# Patient Record
Sex: Female | Born: 2000 | Race: Black or African American | Hispanic: No | Marital: Single | Smoking: Never smoker
Health system: Southern US, Community
[De-identification: ages and names within clinical notes are randomized; demographics above are authoritative.]

## PROBLEM LIST (undated history)

## (undated) DIAGNOSIS — A549 Gonococcal infection, unspecified: Secondary | ICD-10-CM

## (undated) DIAGNOSIS — A749 Chlamydial infection, unspecified: Secondary | ICD-10-CM

## (undated) DIAGNOSIS — F32A Depression, unspecified: Secondary | ICD-10-CM

## (undated) DIAGNOSIS — F419 Anxiety disorder, unspecified: Secondary | ICD-10-CM

## (undated) HISTORY — PX: NO PAST SURGERIES: SHX2092

## (undated) HISTORY — DX: Gonococcal infection, unspecified: A54.9

## (undated) HISTORY — DX: Depression, unspecified: F32.A

## (undated) HISTORY — DX: Chlamydial infection, unspecified: A74.9

## (undated) HISTORY — DX: Anxiety disorder, unspecified: F41.9

---
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
## 2019-12-21 ENCOUNTER — Encounter (HOSPITAL_COMMUNITY): Payer: Self-pay

## 2019-12-21 ENCOUNTER — Emergency Department (HOSPITAL_COMMUNITY)
Admission: EM | Admit: 2019-12-21 | Discharge: 2019-12-22 | Discharge status: Against Medical Advice | Payer: Managed Care, Other (non HMO) | Attending: Emergency Medicine | Admitting: Emergency Medicine

## 2019-12-21 DIAGNOSIS — Z041 Encounter for examination and observation following transport accident: Secondary | ICD-10-CM | POA: Diagnosis present

## 2019-12-21 DIAGNOSIS — Z5321 Procedure and treatment not carried out due to patient leaving prior to being seen by health care provider: Secondary | ICD-10-CM | POA: Insufficient documentation

## 2019-12-21 NOTE — ED Notes (Signed)
Pt refusing blood work in triage.  

## 2019-12-21 NOTE — ED Notes (Signed)
[phone] Maria Beltran MOTHER VERY WORRIED ABOUT HER DAUGHTER -PLEASE GIVE A CALL BACK
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
## 2019-12-22 NOTE — ED Notes (Signed)
Pt called to see if still in lobby, no response X3

## 2020-05-03 ENCOUNTER — Other Ambulatory Visit: Payer: Self-pay

## 2020-05-03 ENCOUNTER — Other Ambulatory Visit: Payer: Self-pay | Admitting: Obstetrics and Gynecology

## 2020-05-03 ENCOUNTER — Ambulatory Visit: Payer: Medicaid Other | Admitting: *Deleted

## 2020-05-03 ENCOUNTER — Ambulatory Visit: Payer: Medicaid Other | Attending: Obstetrics and Gynecology

## 2020-05-03 VITALS — Ht 62.0 in

## 2020-05-03 DIAGNOSIS — Z363 Encounter for antenatal screening for malformations: Secondary | ICD-10-CM

## 2020-05-03 DIAGNOSIS — O283 Abnormal ultrasonic finding on antenatal screening of mother: Secondary | ICD-10-CM | POA: Insufficient documentation

## 2020-05-03 DIAGNOSIS — Q7959 Other congenital malformations of abdominal wall: Secondary | ICD-10-CM | POA: Diagnosis not present

## 2020-05-03 DIAGNOSIS — Z3A15 15 weeks gestation of pregnancy: Secondary | ICD-10-CM | POA: Diagnosis not present

## 2020-05-03 DIAGNOSIS — O418X10 Other specified disorders of amniotic fluid and membranes, first trimester, not applicable or unspecified: Secondary | ICD-10-CM | POA: Insufficient documentation

## 2020-05-03 IMAGING — US US MFM OB COMP +14 WKS
1 series · 12 of 28 positions shown · non-contrast
Comparison: none

[Series 1: us mfm ob comp +14 wks · 104 acquisitions, 12 frames shown]
[im 4/104]
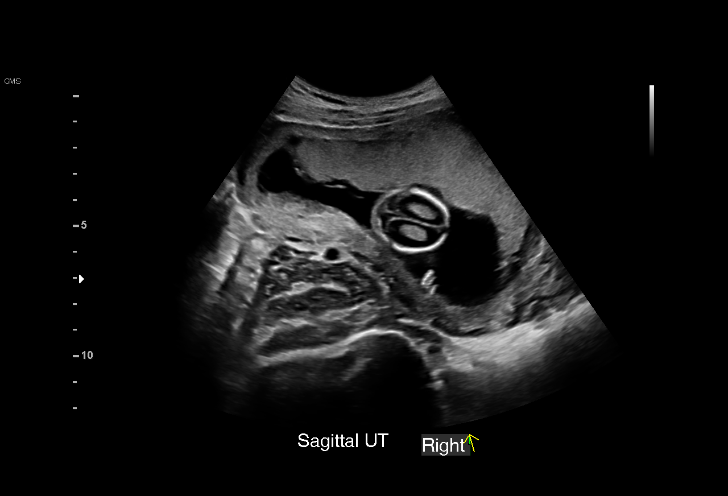
[im 12/104]
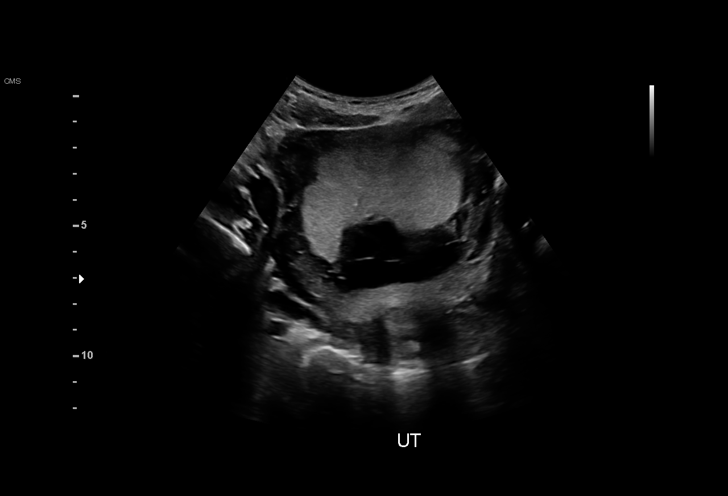
[im 20/104]
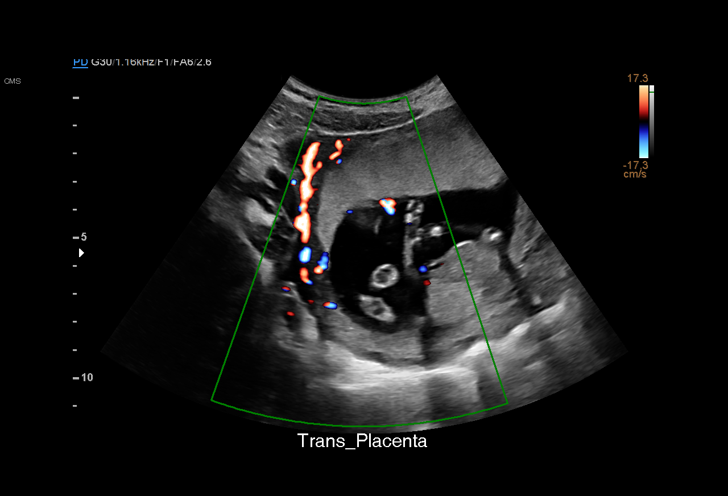
[im 31/104]
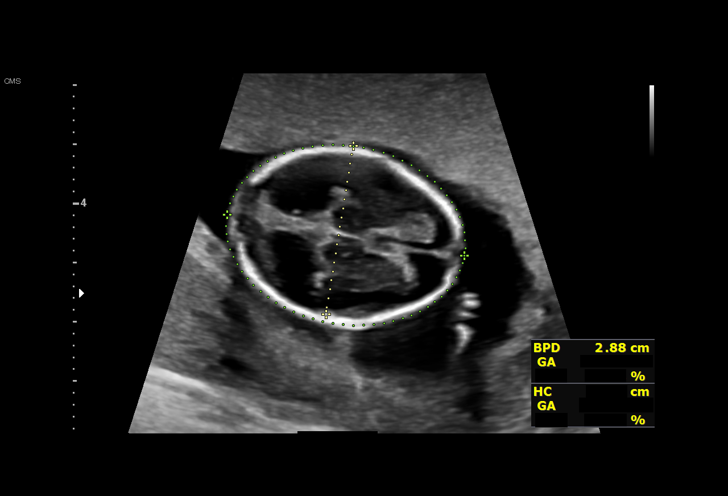
[im 39/104]
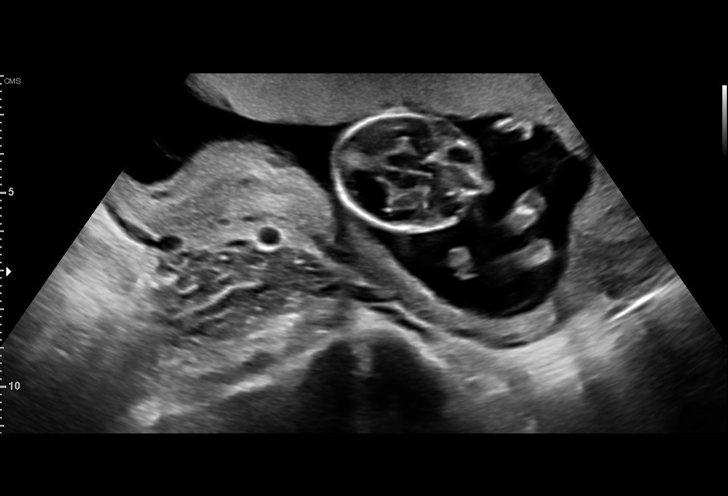
[im 46/104]
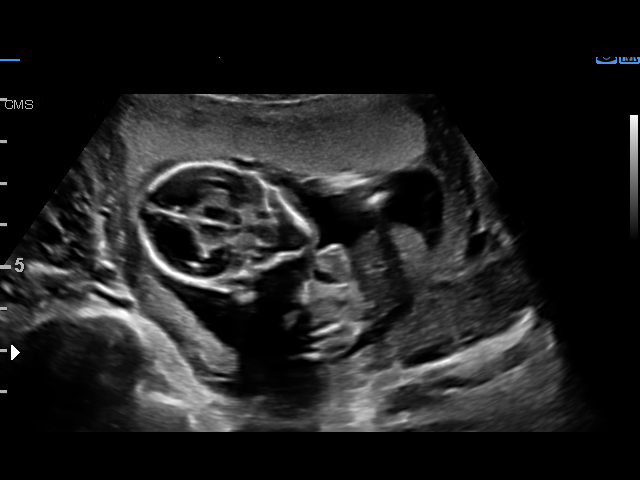
[im 58/104]
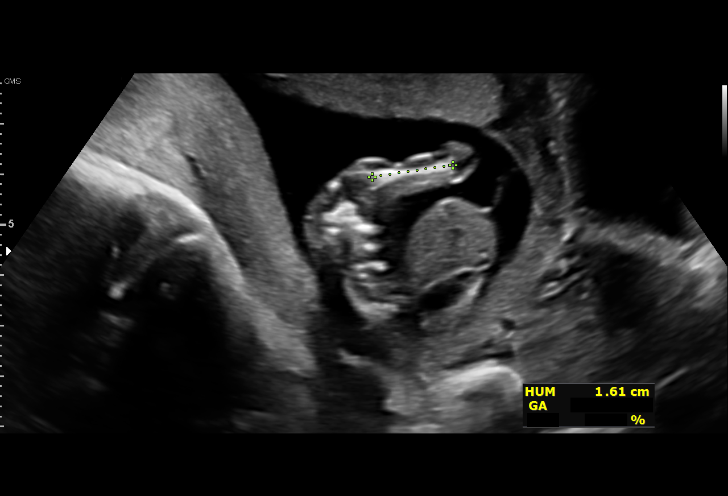
[im 65/104]
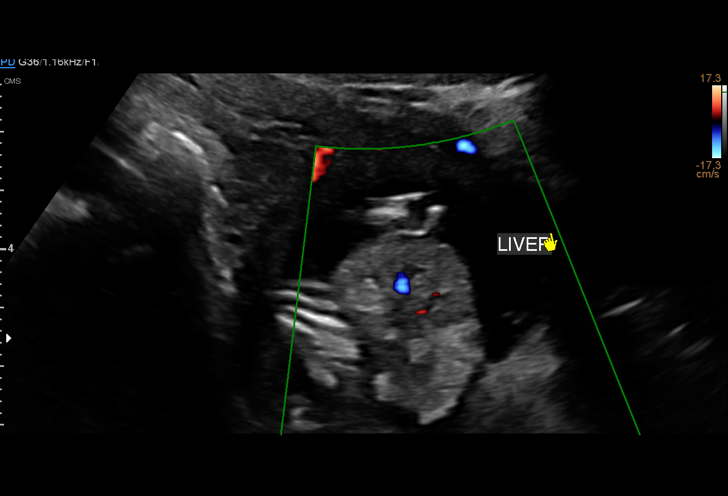
[im 73/104]
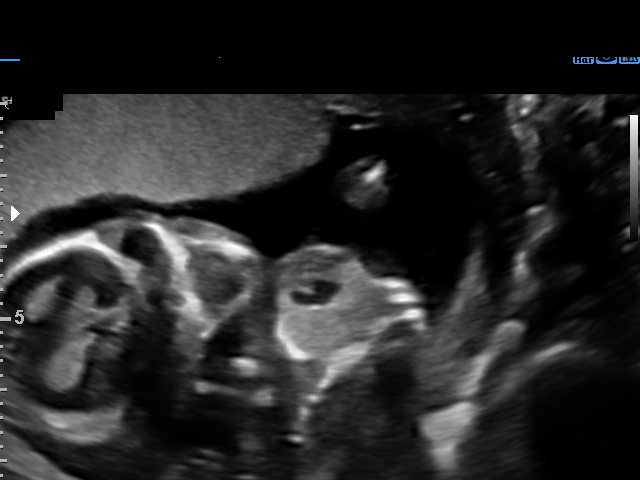
[im 84/104]
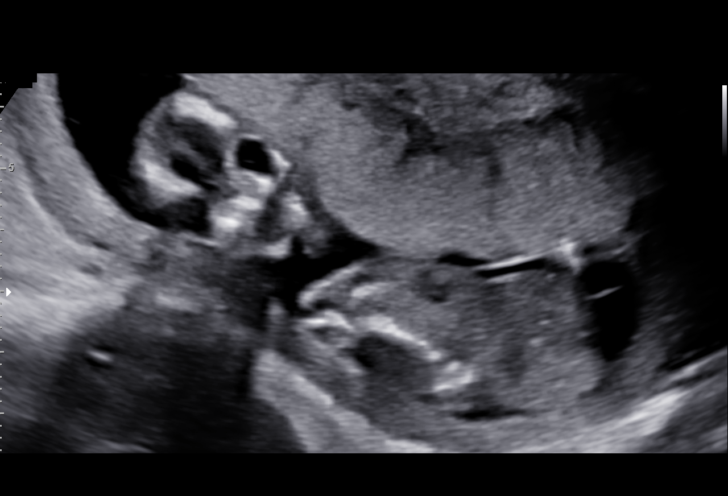
[im 92/104]
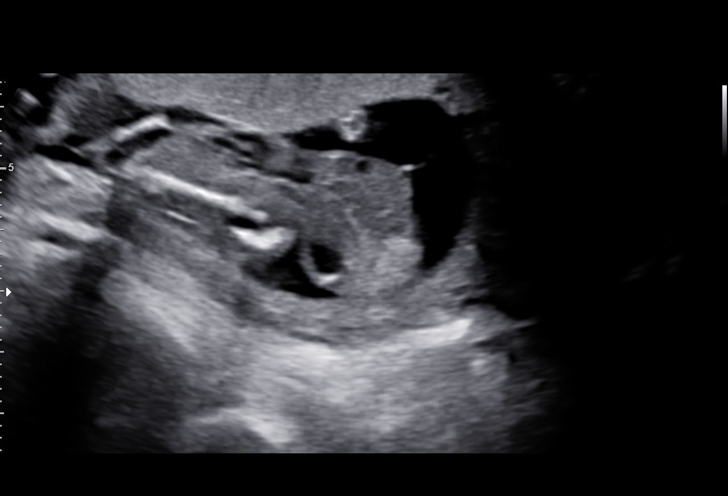
[im 100/104]
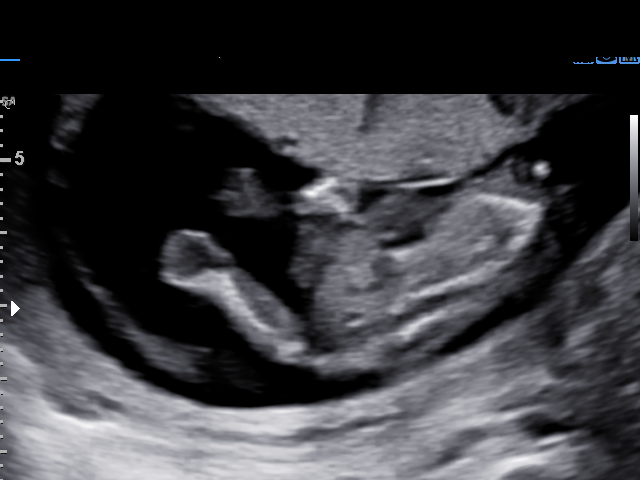

[12 of 28 positions shown; findings below may reference images not displayed]

Obstetrics &
                                                            Gynecology
                                                            [address]
                                                            [place]
                   CNM

 1  US MFM OB COMP + 14 WK                [address].01    VANITY HURON

Indications

 Amniotic band (LBW defect)
 15 weeks gestation of pregnancy
 Antenatal screening for malformations
Fetal Evaluation

 Num Of Fetuses:         1
 Fetal Heart Rate(bpm):  131
 Cardiac Activity:       Observed
 Presentation:           Compound
 Placenta:               Anterior
 P. Cord Insertion:      Visualized

 Amniotic Fluid
 AFI FV:      Within normal limits

                             Largest Pocket(cm)

Biometry

 BPD:      28.6  mm     G. Age:  15w 1d         43  %    CI:        67.73   %    70 - 86
                                                         FL/HC:      15.1   %    15.3 -
 HC:      111.2  mm     G. Age:  15w 3d         45  %    HC/AC:      1.21        1.05 -
 AC:       91.9  mm     G. Age:  15w 3d         62  %    FL/BPD:     58.7   %
 FL:       16.8  mm     G. Age:  15w 0d         37  %    FL/AC:      18.3   %    20 - 24
 HUM:      16.4  mm     G. Age:  14w 5d         38  %
 CER:      14.6  mm     G. Age:  15w 4d         37  %
 LV:        6.3  mm
 CM:        2.4  mm

 Est. FW:     117  gm      0 lb 4 oz     40  %
OB History

 Gravidity:    1         Term:   0        Prem:   0        SAB:   0
 TOP:          0       Ectopic:  0        Living: 0
Gestational Age

 LMP:           14w 6d        Date:  01/20/20                 EDD:   10/26/20
 U/S Today:     15w 2d                                        EDD:   10/23/20
 Best:          15w 1d     Det. By:  Previous Ultrasound      EDD:   10/24/20
                                     (05/02/20)
Anatomy

 Cranium:               Appears normal         LVOT:                   Not well visualized
 Cavum:                 Not well visualized    Aortic Arch:            Not well visualized
 Ventricles:            Appears normal         Ductal Arch:            Not well visualized
 Choroid Plexus:        Appears normal         Diaphragm:              Not well visualized
 Cerebellum:            Banana sign            Abdomen:                Limb-body wall
                                                                       complex
 Nuchal Fold:           Not applicable (< 16   Abdominal Wall:         Omphalocele
                        wks GA)
 Face:                  Orbits nl; profile not Cord Vessels:           Not well visualized
                        well visualized
 Lips:                  Not well visualized    Kidneys:                Not well visualized
 Palate:                Not well visualized    Bladder:                Not well visualized
 Thoracic:              Abnormal, see          Spine:                  Limb-body wall
                        comments                                       complex
 Heart:                 Not well visualized    Upper Extremities:      Visualized
 RVOT:                  Not well visualized    Lower Extremities:      Visualized
Cervix Uterus Adnexa

 Cervix
 Length:           3.18  cm.
 Normal appearance by transabdominal scan.

 Uterus
 No abnormality visualized.

 Right Ovary
 Not visualized.

 Left Ovary
 Not visualized.

 Cul De Sac
 No free fluid seen.
 Adnexa
 No abnormality visualized.
Comments

 This patient was seen for an ultrasound exam due to a
 possible anterior abdominal wall defect that was noted during
 an ultrasound performed in your office.  This is the patient's
 first pregnancy.  She denies any prior uterine surgeries.  She
 denies any significant past medical history and denies any
 problems in her current pregnancy.

 The results of her screening tests for fetal aneuploidy are
 currently pending.

 The fetal growth and amniotic fluid level appeared
 appropriate for her gestational age.

 On today's exam, a probable body stalk abnormality is noted.
 It appears that the intra-abdominal contents are outside of
 the fetus's body and the intra-abdominal contents may be
 adherent to the membranes of the gestational sac.  The fetal
 heart appears to be dragged down and possibly out of the
 anterior abdominal wall defect.  A normal-appearing anterior
 placenta is noted.  The intra-abdominal contents are not
 attached to the placenta.

 The patient was advised that if the anomaly noted today is a
 body stalk anomaly, that the prognosis is usually poor.  The
 other causes of today's findings may be a giant omphalocele
 or the amniotic band syndrome.  The patient's history does
 not place her at significant risk for an amniotic band.  She
 was reassured that there was nothing that she did to have
 caused this anomaly.

 As the patient was extremely upset regarding today's
 ultrasound findings, I advised her that I will order a fetal MRI
 with Ct within the next week to better delineate the cause
 of today's ultrasound findings.

 She will return to our office in 2 weeks for another ultrasound
 exam.  The patient was advised that should a body stalk
 anomaly be confirmed by the MRI, that termination of
 pregnancy or continued expectant management would be
 options that would still be available to her in 2 weeks.  We will
 also have her speak to our genetic counselor at that visit.
 The patient was too upset to speak to our genetic counselor
 today.

## 2020-05-08 ENCOUNTER — Other Ambulatory Visit: Payer: Self-pay

## 2020-05-08 ENCOUNTER — Other Ambulatory Visit: Payer: Self-pay | Admitting: *Deleted

## 2020-05-08 DIAGNOSIS — Z362 Encounter for other antenatal screening follow-up: Secondary | ICD-10-CM

## 2020-05-09 ENCOUNTER — Encounter: Payer: Self-pay | Admitting: Pediatric Radiology

## 2020-05-10 ENCOUNTER — Ambulatory Visit: Payer: Managed Care, Other (non HMO) | Attending: Obstetrics and Gynecology

## 2020-05-10 ENCOUNTER — Ambulatory Visit: Payer: Managed Care, Other (non HMO) | Admitting: *Deleted

## 2020-05-10 ENCOUNTER — Other Ambulatory Visit: Payer: Self-pay | Admitting: Obstetrics

## 2020-05-10 ENCOUNTER — Other Ambulatory Visit: Payer: Self-pay

## 2020-05-10 ENCOUNTER — Encounter: Payer: Self-pay | Admitting: *Deleted

## 2020-05-10 VITALS — BP 100/53 | HR 80

## 2020-05-10 DIAGNOSIS — Z362 Encounter for other antenatal screening follow-up: Secondary | ICD-10-CM

## 2020-05-10 DIAGNOSIS — Z148 Genetic carrier of other disease: Secondary | ICD-10-CM

## 2020-05-10 DIAGNOSIS — O321XX0 Maternal care for breech presentation, not applicable or unspecified: Secondary | ICD-10-CM

## 2020-05-10 DIAGNOSIS — O418X90 Other specified disorders of amniotic fluid and membranes, unspecified trimester, not applicable or unspecified: Secondary | ICD-10-CM | POA: Diagnosis not present

## 2020-05-10 DIAGNOSIS — O289 Unspecified abnormal findings on antenatal screening of mother: Secondary | ICD-10-CM | POA: Diagnosis not present

## 2020-05-10 DIAGNOSIS — Z3A16 16 weeks gestation of pregnancy: Secondary | ICD-10-CM

## 2020-05-10 IMAGING — US US MFM OB LIMITED
1 series · 15 of 26 positions shown · non-contrast
Comparison: none

[Series 1: us mfm ob limited · 26 acquisitions, 15 frames shown]
[im 1/26]
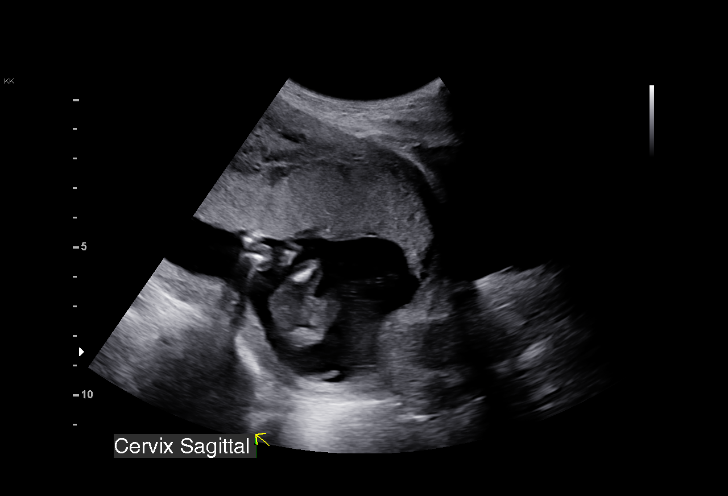
[im 3/26]
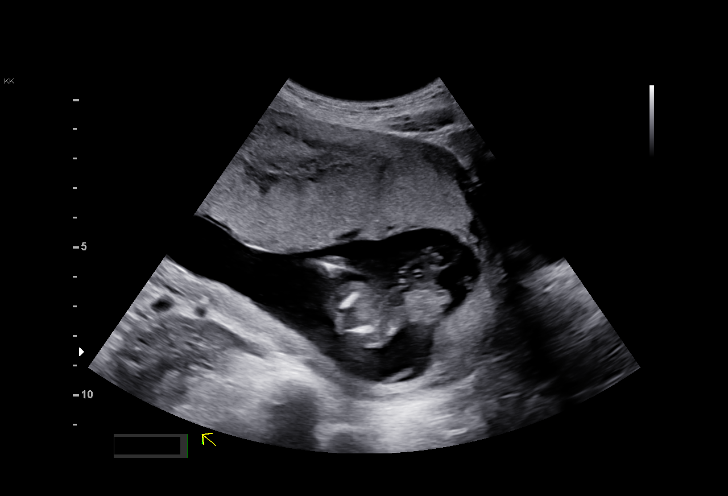
[im 5/26]
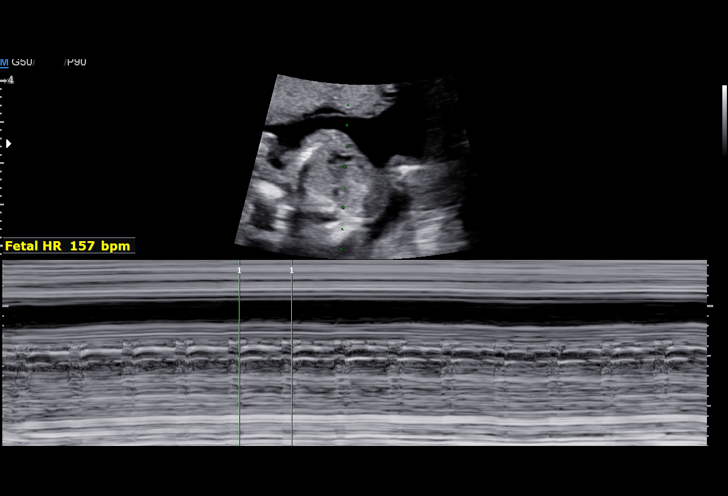
[im 7/26]
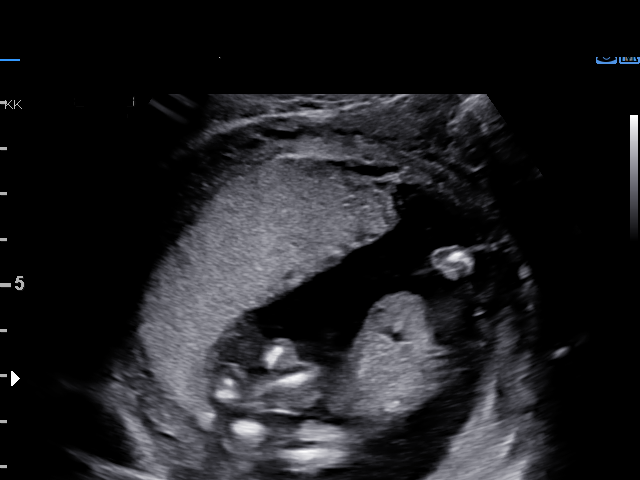
[im 8/26]
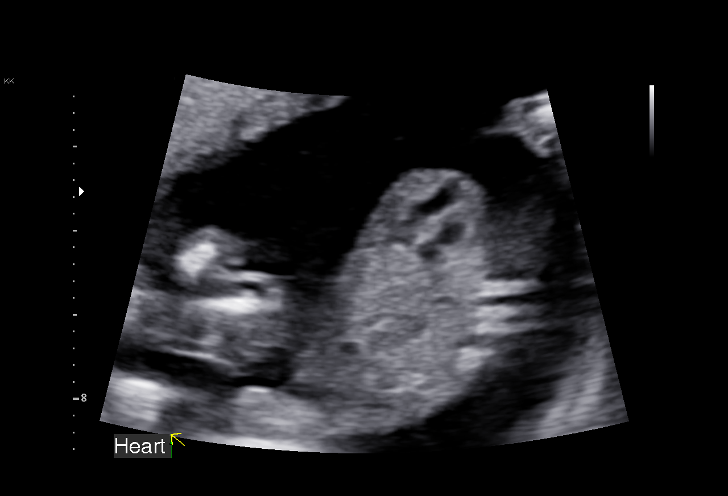
[im 10/26]
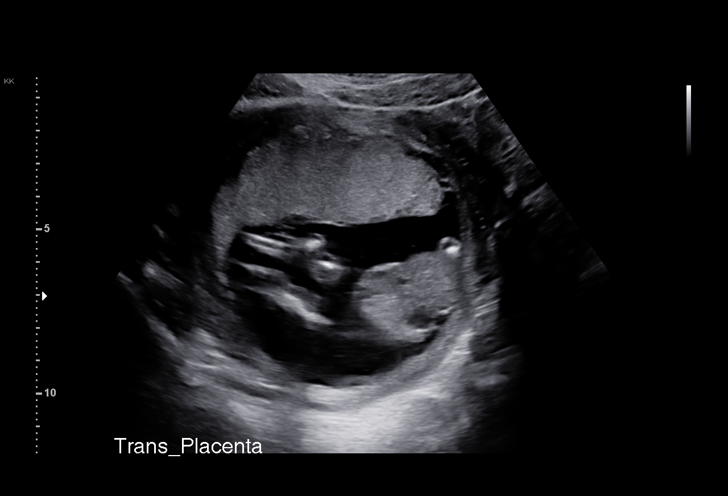
[im 12/26]
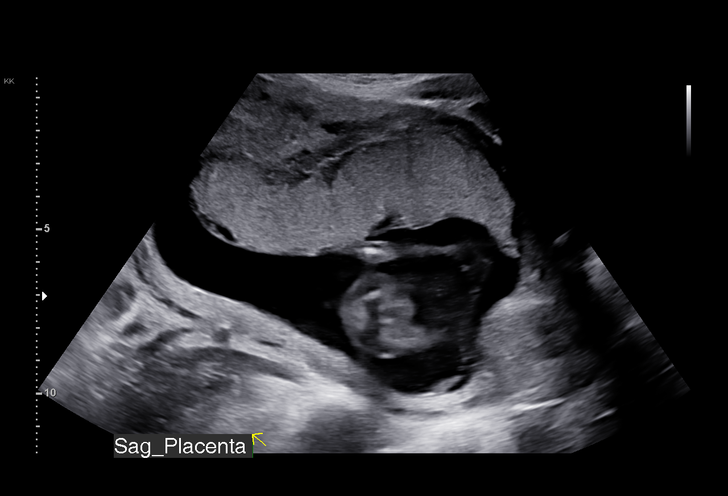
[im 14/26]
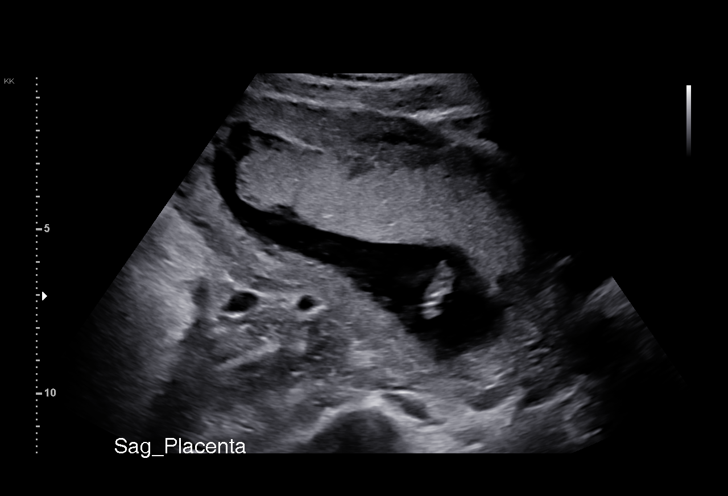
[im 15/26]
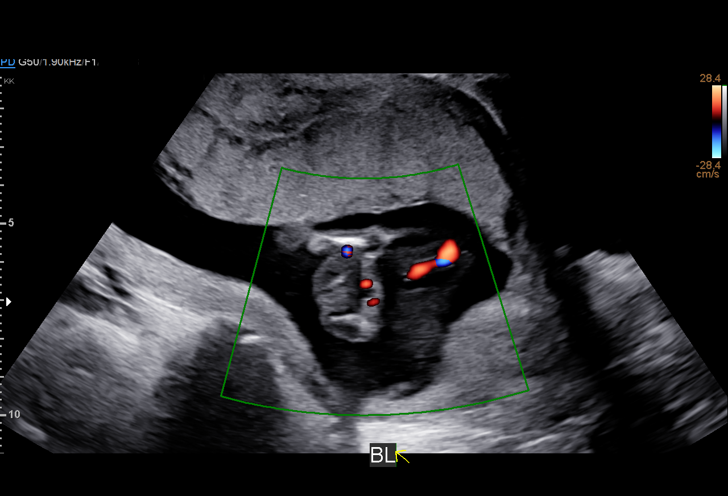
[im 17/26]
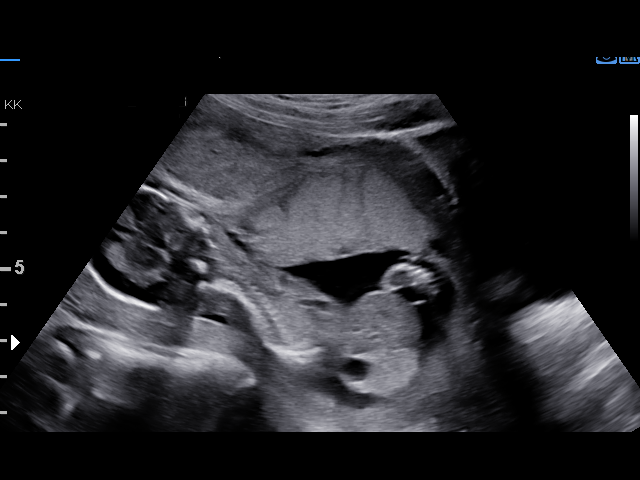
[im 19/26]
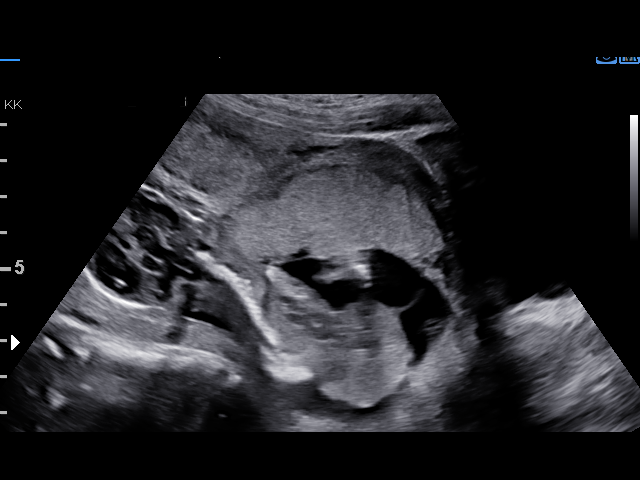
[im 20/26]
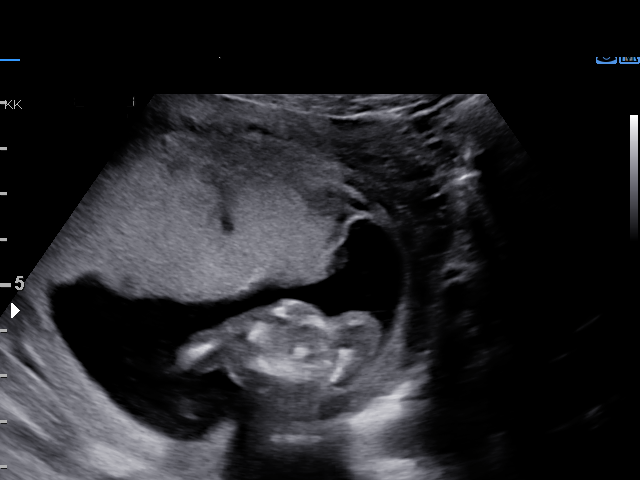
[im 22/26]
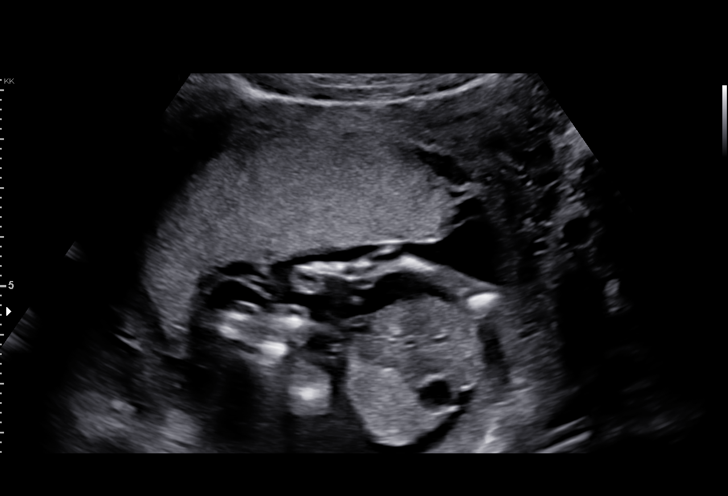
[im 24/26]
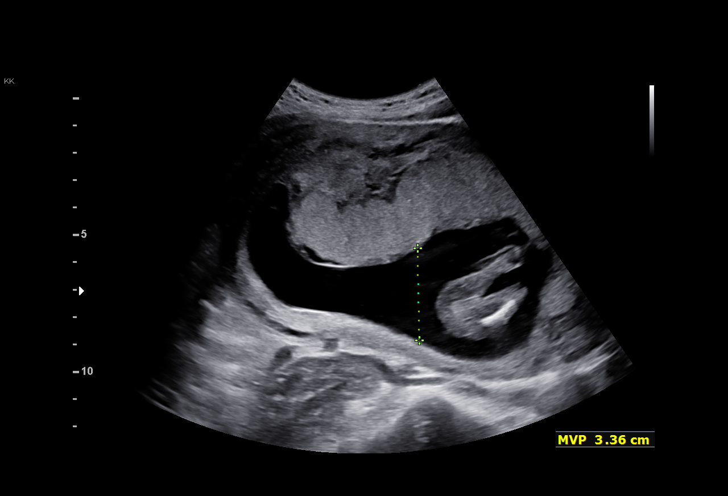
[im 26/26]
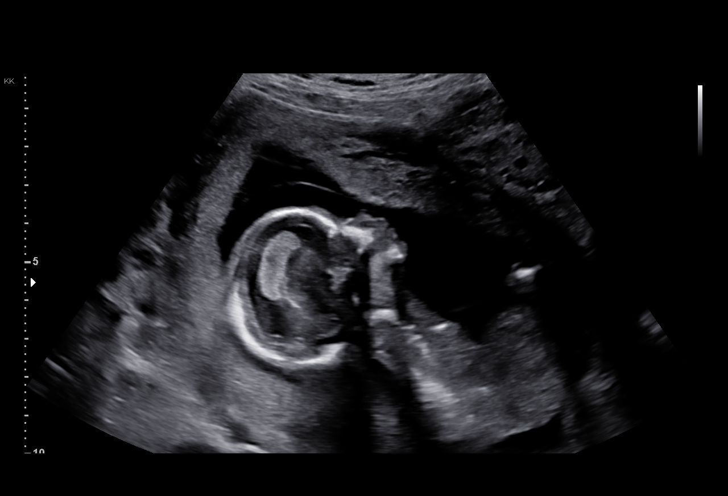

[15 of 26 positions shown; findings below may reference images not displayed]

Obstetrics &
                                                            Gynecology
                                                            [address]
                                                            [place]
                   CNM

Indications

 Amniotic band (LBW defect)
 Abnormal finding on antenatal screening
 (OSB [DATE])
 Genetic carrier (Sickle Cell Trait)
 16 weeks gestation of pregnancy
Fetal Evaluation

 Num Of Fetuses:         1
 Fetal Heart Rate(bpm):  157
 Cardiac Activity:       Observed
 Presentation:           Breech
 Placenta:               Anterior
 P. Cord Insertion:      Previously Visualized

 Amniotic Fluid
 AFI FV:      Within normal limits

                             Largest Pocket(cm)

OB History
 Gravidity:    1         Term:   0        Prem:   0        SAB:   0
 TOP:          0       Ectopic:  0        Living: 0
Gestational Age

 LMP:           15w 6d        Date:  01/20/20                 EDD:   10/26/20
 Best:          16w 1d     Det. By:  Previous Ultrasound      EDD:   10/24/20
                                     (05/02/20)
Cervix Uterus Adnexa

 Cervix
 Normal appearance by transabdominal scan.
Comments

 This patient was seen for a follow-up exam due to a fetus with
 either a limb body wall complex or a large omphalocele.  She
 had a an MRI performed at [facility] yesterday to better define
 the abnormality that has been noted on her ultrasound
 exams.  The results of the MRI are currently pending.

 An anterior abdominal wall defect where the intra-abdominal
 contents appear to be herniated outside of the body and
 attached to the gestational sac continues to be noted today.

 After I informed the patient that the MRI results are currently
 pending, she stormed out of the office saying she did not
 understand why she was even here.  I will call the patient
 once the MRI results are available and discuss the diagnosis
 and management options with her.

 She declined any further ultrasound exams in our office.

## 2020-05-17 ENCOUNTER — Ambulatory Visit: Payer: Managed Care, Other (non HMO)

## 2020-07-01 ENCOUNTER — Telehealth (HOSPITAL_COMMUNITY): Payer: Self-pay | Admitting: Lactation Services

## 2020-07-01 NOTE — Telephone Encounter (Signed)
Incoming call from mother:  Mother called with questions regarding engorgement.  Mother had a still birth and her breasts have become engorged.  She has been wearing a supportive bra with no relief.  Discussed further relief measures.  Mother does not have a manual pump or a DEBP for home use.  WIC referral faxed to see if mother can obtain a DEBP to help with engorgement.  Mother will follow up with a phone call to [facility] now.  If she is unable to obtain a pump I will provide a manual pump for pick up.  Mother appreciative.

## 2020-07-02 ENCOUNTER — Telehealth (HOSPITAL_COMMUNITY): Payer: Self-pay | Admitting: Lactation Services

## 2020-07-02 NOTE — Telephone Encounter (Signed)
Spoke with Mother who lost baby at 58 weeks.  Her breasts feel engorged.  She spoke with her OB/GYN who recommend she not start pumping.  I asked her what her goal is and she wanted to dry up her milk and not donate her milk.    She is wearing a supportive bra.  Recommend cabbage leaves.  Crush leaves to release enzymes and replace when they get warm and wilt.  Follow up with  ice and suggest discussing OTC pain relievers per her OB/GYN recommendations.  Mother will call if she decides she would like to pick up a manual pump but plans to not start pumping at this time.

## 2021-08-14 ENCOUNTER — Other Ambulatory Visit: Payer: Self-pay | Admitting: Family Medicine

## 2021-10-31 ENCOUNTER — Ambulatory Visit
Admission: EM | Admit: 2021-10-31 | Discharge: 2021-10-31 | Disposition: A | Discharge status: Home / Self Care | Payer: Medicaid Other | Attending: Family Medicine | Admitting: Family Medicine

## 2021-10-31 ENCOUNTER — Encounter: Payer: Self-pay | Admitting: Emergency Medicine

## 2021-10-31 DIAGNOSIS — A549 Gonococcal infection, unspecified: Secondary | ICD-10-CM | POA: Diagnosis not present

## 2021-10-31 MED ORDER — CEFTRIAXONE SODIUM 500 MG IJ SOLR
500.0000 mg | Freq: Once | INTRAMUSCULAR | Status: AC
  Administered 2021-10-31: 500 mg via INTRAMUSCULAR

## 2021-10-31 NOTE — ED Provider Notes (Signed)
?[facility] ? ? ? ?CSN: [id number] ?Arrival date & time: 10/31/21  3614 ? ? ?  ? ?History   ?Chief Complaint ?Chief Complaint  ?Patient presents with  ? Exposure to STD  ? ? ?HPI ?Maria Beltran is a 21 y.o. female.  ? ? ?Exposure to STD ? ?Here for history of testing positive for gonorrhea last week.  The clinic that did the testing notified her, and then said that she had to make an appointment to get treated.  Their next available appointment was in May.  So patient comes here to get treated.  No fever or chills or abdominal pain. ? ?Past Medical History:  ?Diagnosis Date  ? Anxiety   ? Chlamydia   ? Depression   ? Gonorrhea   ? ? ?There are no problems to display for this patient. ? ? ?Past Surgical History:  ?Procedure Laterality Date  ? NO PAST SURGERIES    ? ? ?OB History   ? ? Gravida  ?1  ? Para  ?   ? Term  ?   ? Preterm  ?   ? AB  ?   ? Living  ?   ?  ? ? SAB  ?   ? IAB  ?   ? Ectopic  ?   ? Multiple  ?   ? Live Births  ?   ?   ?  ?  ? ? ? ?Home Medications   ? ?Prior to Admission medications   ?Medication Sig Start Date End Date Taking? Authorizing Provider  ?Prenatal Vit-Fe Fumarate-FA (PRENATAL VITAMINS PO) Take by mouth. ?Patient not taking: Reported on 05/10/2020    [provider]  ? ? ?Family History ?Family History  ?Problem Relation Age of Onset  ? Diabetes Maternal Grandfather   ? Diabetes Maternal Aunt   ? ? ?Social History ?Social History  ? ?Tobacco Use  ? Smoking status: Never  ? Smokeless tobacco: Never  ?Vaping Use  ? Vaping Use: Never used  ?Substance Use Topics  ? Alcohol use: Not Currently  ? Drug use: Never  ? ? ? ?Allergies   ?Patient has no known allergies. ? ? ?Review of Systems ?Review of Systems ? ? ?Physical Exam ?Triage Vital Signs ?ED Triage Vitals  ?Enc Vitals Group  ?   BP 10/31/21 0955 116/68  ?   Pulse Rate 10/31/21 0955 87  ?   Resp 10/31/21 0955 18  ?   Temp 10/31/21 0955 99.1 ?F (37.3 ?C)  ?   Temp Source 10/31/21 0955 Oral  ?   SpO2 10/31/21 0955  97 %  ?   Weight 10/31/21 0956 120 lb (54.4 kg)  ?   Height 10/31/21 0956 5\' 2"  (1.575 m)  ?   Head Circumference --   ?   Peak Flow --   ?   Pain Score 10/31/21 0956 0  ?   Pain Loc --   ?   Pain Edu? --   ?   Excl. in GC? --   ? ?No data found. ? ?Updated Vital Signs ?BP 116/68 (BP Location: Left Arm)   Pulse 87   Temp 99.1 ?F (37.3 ?C) (Oral)   Resp 18   Ht 5\' 2"  (1.575 m)   Wt 54.4 kg   LMP 10/18/2021   SpO2 97%   Breastfeeding No   BMI 21.95 kg/m?  ? ?Visual Acuity ?Right Eye Distance:   ?Left Eye Distance:   ?Bilateral Distance:   ? ?  Right Eye Near:   ?Left Eye Near:    ?Bilateral Near:    ? ?Physical Exam ? ? ?UC Treatments / Results  ?Labs ?(all labs ordered are listed, but only abnormal results are displayed) ?Labs Reviewed - No data to display ? ?EKG ? ? ?Radiology ?No results found. ? ?Procedures ?Procedures (including critical care time) ? ?Medications Ordered in UC ?Medications  ?cefTRIAXone (ROCEPHIN) injection 500 mg (has no administration in time range)  ? ? ?Initial Impression / Assessment and Plan / UC Course  ?I have reviewed the triage vital signs and the nursing notes. ? ?Pertinent labs & imaging results that were available during my care of the patient were reviewed by me and considered in my medical decision making (see chart for details). ? ?  ? ?We will go ahead and treat with ceftriaxone with her history of positive GC.  She was not positive for any other infections. ?Final Clinical Impressions(s) / UC Diagnoses  ? ?Final diagnoses:  ?Gonorrhea  ? ? ? ?Discharge Instructions   ? ?  ?You have been given a shot of ceftriaxone 500 mg here in the office ?Rest from intercourse for 1 to 2 weeks ? ? ? ? ? ?ED Prescriptions   ?None ?  ? ?PDMP not reviewed this encounter. ?  ?Zenia Resides, MD ?10/31/21 1026 ? ?

## 2021-10-31 NOTE — Discharge Instructions (Addendum)
You have been given a shot of ceftriaxone 500 mg here in the office ?Rest from intercourse for 1 to 2 weeks ? ?

## 2021-10-31 NOTE — ED Triage Notes (Signed)
Patient states that she has a positive GC last week and hasn't been tested.  Patient received her results on Monday and the next available appt they had was the end of May.  Patient requesting treatment. ?

## 2022-03-12 ENCOUNTER — Encounter: Payer: Medicaid Other | Admitting: Obstetrics and Gynecology

## 2022-08-14 ENCOUNTER — Other Ambulatory Visit: Payer: Self-pay | Admitting: Family Medicine

## 2022-08-14 ENCOUNTER — Other Ambulatory Visit (HOSPITAL_COMMUNITY)
Admission: RE | Admit: 2022-08-14 | Discharge: 2022-08-14 | Disposition: A | Discharge status: Home / Self Care | Payer: Medicaid Other | Source: Ambulatory Visit | Attending: Family Medicine | Admitting: Family Medicine

## 2022-08-14 DIAGNOSIS — N898 Other specified noninflammatory disorders of vagina: Secondary | ICD-10-CM | POA: Diagnosis present

## 2022-08-14 DIAGNOSIS — Z113 Encounter for screening for infections with a predominantly sexual mode of transmission: Secondary | ICD-10-CM | POA: Insufficient documentation

## 2022-08-14 DIAGNOSIS — Z124 Encounter for screening for malignant neoplasm of cervix: Secondary | ICD-10-CM | POA: Insufficient documentation

## 2022-08-18 LAB — MOLECULAR ANCILLARY ONLY
Bacterial Vaginitis (gardnerella): POSITIVE — AB
Candida Glabrata: NEGATIVE
Candida Vaginitis: NEGATIVE
Chlamydia: NEGATIVE
Comment: NEGATIVE
Comment: NEGATIVE
Comment: NEGATIVE
Comment: NEGATIVE
Comment: NEGATIVE
Comment: NORMAL
Neisseria Gonorrhea: NEGATIVE
Trichomonas: NEGATIVE

## 2022-08-19 LAB — CYTOLOGY - PAP
Adequacy: ABSENT
Diagnosis: NEGATIVE

## 2022-09-02 ENCOUNTER — Encounter: Payer: Medicaid Other | Admitting: Certified Nurse Midwife

## 2022-09-17 ENCOUNTER — Encounter: Payer: Medicaid Other | Admitting: Obstetrics and Gynecology

## 2022-10-08 ENCOUNTER — Other Ambulatory Visit: Payer: Self-pay

## 2022-10-08 ENCOUNTER — Encounter (HOSPITAL_COMMUNITY): Payer: Self-pay | Admitting: Family Medicine

## 2022-10-08 ENCOUNTER — Inpatient Hospital Stay (HOSPITAL_COMMUNITY)
Admission: AD | Admit: 2022-10-08 | Discharge: 2022-10-08 | Disposition: A | Discharge status: Home / Self Care | Payer: Medicaid Other | Attending: Obstetrics & Gynecology | Admitting: Obstetrics & Gynecology

## 2022-10-08 DIAGNOSIS — R109 Unspecified abdominal pain: Secondary | ICD-10-CM | POA: Diagnosis not present

## 2022-10-08 DIAGNOSIS — R531 Weakness: Secondary | ICD-10-CM | POA: Diagnosis not present

## 2022-10-08 DIAGNOSIS — O26891 Other specified pregnancy related conditions, first trimester: Secondary | ICD-10-CM | POA: Diagnosis not present

## 2022-10-08 DIAGNOSIS — R1030 Lower abdominal pain, unspecified: Secondary | ICD-10-CM | POA: Insufficient documentation

## 2022-10-08 DIAGNOSIS — Z3A11 11 weeks gestation of pregnancy: Secondary | ICD-10-CM

## 2022-10-08 DIAGNOSIS — O26851 Spotting complicating pregnancy, first trimester: Secondary | ICD-10-CM | POA: Diagnosis not present

## 2022-10-08 DIAGNOSIS — N939 Abnormal uterine and vaginal bleeding, unspecified: Secondary | ICD-10-CM

## 2022-10-08 DIAGNOSIS — O26899 Other specified pregnancy related conditions, unspecified trimester: Secondary | ICD-10-CM

## 2022-10-08 LAB — WET PREP, GENITAL
Clue Cells Wet Prep HPF POC: NONE SEEN
Sperm: NONE SEEN
Trich, Wet Prep: NONE SEEN
WBC, Wet Prep HPF POC: <10 (ref <10)
Yeast Wet Prep HPF POC: NONE SEEN

## 2022-10-08 LAB — URINALYSIS, ROUTINE W REFLEX MICROSCOPIC
Bilirubin Urine: NEGATIVE
Glucose, UA: NEGATIVE mg/dL
Hgb urine dipstick: NEGATIVE
Ketones, ur: NEGATIVE mg/dL
Leukocytes,Ua: NEGATIVE
Nitrite: NEGATIVE
Protein, ur: NEGATIVE mg/dL
Specific Gravity, Urine: 1.005 (ref 1.005–1.030)
pH: 8 (ref 5.0–8.0)

## 2022-10-08 LAB — POCT PREGNANCY, URINE: Preg Test, Ur: POSITIVE — AB

## 2022-10-08 MED ORDER — ACETAMINOPHEN 500 MG PO TABS
1000.0000 mg | ORAL_TABLET | Freq: Once | ORAL | Status: AC
  Administered 2022-10-08: 1000 mg via ORAL
  Filled 2022-10-08: qty 2

## 2022-10-08 NOTE — MAU Provider Note (Signed)
History     CSN: [id number]  Arrival date and time: 10/08/22 0751   Event Date/Time   First Provider Initiated Contact with Patient 10/08/22 0831      Chief Complaint  Patient presents with   Abdominal Pain   Vaginal Bleeding   HPI Maria Beltran is a 22 y.o. G2P0 at [redacted]w[redacted]d who presents to MAU for lower abdominal cramping and spotting. Patient reports she woke up at 5am and had some lower abdominal cramping and "felt weak". At 7am she went to the bathroom and noticed some pink spotting with wiping. She reports she tried to use a doppler last night to listen to the FHR and when she couldn't hear it and saw the spotting she got worried and came in. She reports an increase in vaginal discharge as well as vaginal itching x1 month. She denies recent intercourse or urinary s/s.   Patient is scheduled for a [facility] in April.   OB History     Gravida  2   Para      Term      Preterm      AB      Living         SAB      IAB      Ectopic      Multiple      Live Births              Past Medical History:  Diagnosis Date   Anxiety    Chlamydia    Depression    Gonorrhea     Past Surgical History:  Procedure Laterality Date   NO PAST SURGERIES      Family History  Problem Relation Age of Onset   Diabetes Maternal Grandfather    Diabetes Maternal Aunt     Social History   Tobacco Use   Smoking status: Never   Smokeless tobacco: Never  Vaping Use   Vaping Use: Never used  Substance Use Topics   Alcohol use: Not Currently   Drug use: Never    Allergies: No Known Allergies  No medications prior to admission.   Review of Systems  Constitutional: Negative.   Gastrointestinal:  Positive for abdominal pain (cramping).  Genitourinary:  Positive for vaginal bleeding (spotting) and vaginal discharge.       Vaginal itching   All other systems reviewed and are negative.  Physical Exam   Blood pressure 106/62, pulse 85,  temperature 99 F (37.2 C), temperature source Oral, resp. rate 16, height 5\' 2"  (1.575 m), weight 53.9 kg, last menstrual period 07/21/2022, SpO2 100 %.  Physical Exam Vitals and nursing note reviewed.  Constitutional:      General: She is not in acute distress. Cardiovascular:     Rate and Rhythm: Normal rate.  Pulmonary:     Effort: Pulmonary effort is normal. No respiratory distress.  Abdominal:     Palpations: Abdomen is soft.     Tenderness: There is no abdominal tenderness.  Genitourinary:    Comments: Patient self-swabbed Skin:    General: Skin is warm and dry.  Neurological:     General: No focal deficit present.     Mental Status: She is alert and oriented to person, place, and time.    FHR: 160 bpm via doppler  Results for orders placed or performed during the hospital encounter of 10/08/22 (from the past 24 hour(s))  Pregnancy, urine POC     Status: Abnormal   Collection  Time: 10/08/22  8:16 AM  Result Value Ref Range   Preg Test, Ur POSITIVE (A) NEGATIVE  Urinalysis, Routine w reflex microscopic -Urine, Clean Catch     Status: None   Collection Time: 10/08/22  8:18 AM  Result Value Ref Range   Color, Urine YELLOW YELLOW   APPearance CLEAR CLEAR   Specific Gravity, Urine 1.005 1.005 - 1.030   pH 8.0 5.0 - 8.0   Glucose, UA NEGATIVE NEGATIVE mg/dL   Hgb urine dipstick NEGATIVE NEGATIVE   Bilirubin Urine NEGATIVE NEGATIVE   Ketones, ur NEGATIVE NEGATIVE mg/dL   Protein, ur NEGATIVE NEGATIVE mg/dL   Nitrite NEGATIVE NEGATIVE   Leukocytes,Ua NEGATIVE NEGATIVE  Wet prep, genital     Status: None   Collection Time: 10/08/22  8:50 AM   Specimen: Vaginal  Result Value Ref Range   Yeast Wet Prep HPF POC NONE SEEN NONE SEEN   Trich, Wet Prep NONE SEEN NONE SEEN   Clue Cells Wet Prep HPF POC NONE SEEN NONE SEEN   WBC, Wet Prep HPF POC <10 <10   Sperm NONE SEEN     MAU Course  Procedures  MDM UA Wet prep, GC/CT Tylenol  UA and wet prep negative. GC/CT  pending. Tylenol improved pain. Spoke with patient regarding the use of doppler to listen to FHR and that I advised against using.    Assessment and Plan   1. [redacted] weeks gestation of pregnancy   2. Abdominal pain affecting pregnancy   3. Vaginal spotting    - Discharge home in stable condition - Return precautions given. Return to MAU as needed - May continue to use Tylenol prn - Keep OB appointment as scheduled   Renee Harder, CNM 10/08/2022, 10:26 AM

## 2022-10-08 NOTE — MAU Note (Signed)
Maria Beltran is a 22 y.o. at Unknown here in MAU reporting: lower abdominal cramping and spotting with wiping that began this morning.  Denies recent intercourse.  +HPT. LMP: 07/21/2022 Onset of complaint: today Pain score: 6 Vitals:   10/08/22 0808  BP: 107/69  Pulse: 87  Temp: 99 F (37.2 C)  SpO2: 100%     FHT:NA Lab orders placed from triage:   UPT

## 2022-10-09 LAB — GC/CHLAMYDIA PROBE AMP (~~LOC~~) NOT AT ARMC
Chlamydia: NEGATIVE
Comment: NEGATIVE
Comment: NORMAL
Neisseria Gonorrhea: NEGATIVE

## 2023-03-02 ENCOUNTER — Emergency Department (HOSPITAL_BASED_OUTPATIENT_CLINIC_OR_DEPARTMENT_OTHER)
Admission: EM | Admit: 2023-03-02 | Discharge: 2023-03-03 | Disposition: A | Discharge status: Home / Self Care | Payer: Medicaid Other | Attending: Emergency Medicine | Admitting: Emergency Medicine

## 2023-03-02 ENCOUNTER — Other Ambulatory Visit: Payer: Self-pay

## 2023-03-02 ENCOUNTER — Encounter (HOSPITAL_BASED_OUTPATIENT_CLINIC_OR_DEPARTMENT_OTHER): Payer: Self-pay | Admitting: Emergency Medicine

## 2023-03-02 DIAGNOSIS — Z3A32 32 weeks gestation of pregnancy: Secondary | ICD-10-CM | POA: Insufficient documentation

## 2023-03-02 DIAGNOSIS — O26893 Other specified pregnancy related conditions, third trimester: Secondary | ICD-10-CM | POA: Diagnosis present

## 2023-03-02 DIAGNOSIS — R42 Dizziness and giddiness: Secondary | ICD-10-CM | POA: Diagnosis not present

## 2023-03-02 DIAGNOSIS — Z3493 Encounter for supervision of normal pregnancy, unspecified, third trimester: Secondary | ICD-10-CM

## 2023-03-02 MED ORDER — LACTATED RINGERS IV BOLUS: 1000.0000 mL | Freq: Once | INTRAVENOUS | Status: DC

## 2023-03-02 NOTE — ED Notes (Signed)
Pt hooked to toco monitor, [facility] called for 20 min assessment

## 2023-03-02 NOTE — ED Triage Notes (Signed)
Pt in with dizziness that began while sitting around 10pm. Pt states she then went to go lie down to see if that helped, but it did not. Pt states she nearly passed out while walking to the kitchen soon afterward. Pt is [redacted] wks pregnant, denies any contractions, pain, vaginal bleeding or LOF. Reports good movement from baby, all prenatal care at [facility]

## 2023-03-03 NOTE — ED Notes (Signed)
Per [facility] and Dr. Despina Hidden, pt is ok to come off toco-monitoring, and cleared from [facility] standpoint

## 2023-03-03 NOTE — ED Notes (Signed)
Patient verbalizes understanding of discharge instructions. Opportunity for questioning and answers were provided. Armband removed by staff, pt discharged from ED. Ambulated out to lobby with friend

## 2023-03-03 NOTE — Progress Notes (Signed)
Tobi Bastos notified that patient cleared obstetrically per Dr Despina Hidden

## 2023-03-03 NOTE — Discharge Instructions (Signed)
You were seen today for dizziness during pregnancy.  Make sure that you are drinking plenty of fluids.  Make sure that you are getting plenty of rest and elevating your legs when resting.  Follow-up closely with your OB/GYN.

## 2023-03-03 NOTE — Progress Notes (Signed)
Received call from Tobi Bastos at [facility] regarding 32 week G2P0 with complaint of dizziness.  No abdominal pain, Uc's, LOF or vaginal bleeding.  Will monitor remotely.

## 2023-03-03 NOTE — ED Provider Notes (Signed)
[place] EMERGENCY DEPARTMENT AT [facility] Provider Note   CSN: [id number] Arrival date & time: 03/02/23  2310     History {Add pertinent medical, surgical, social history, OB history to HPI:1} Chief Complaint  Patient presents with   Dizziness   [redacted] wks pregnant    Maria Beltran is a 22 y.o. female.  HPI     This is a 22 year old female G3P0 who presents approximately [redacted] weeks pregnant with dizziness and fatigue.  Patient reports for the last day she has had increasing dizziness which has overall improved.  She states that it at some point she felt almost like she might pass out.  She is also had a little bit of shortness of breath.  No chest pain.  No recent fevers or cough.  Reports normal pregnancy.  No loss of fluids.  Good fetal movement.  Home Medications Prior to Admission medications   Medication Sig Start Date End Date Taking? Authorizing Provider  Prenatal Vit-Fe Fumarate-FA (PRENATAL VITAMINS PO) Take by mouth. Patient not taking: Reported on 05/10/2020    [provider]      Allergies    Patient has no known allergies.    Review of Systems   Review of Systems  Constitutional:  Negative for fever.  Respiratory:  Positive for shortness of breath. Negative for cough.   Cardiovascular:  Negative for chest pain.  Neurological:  Positive for dizziness.  All other systems reviewed and are negative.   Physical Exam Updated Vital Signs BP 102/65 (BP Location: Left Arm)   Pulse 94   Temp 98.6 F (37 C) (Oral)   Resp 18   Wt 54 kg   LMP 07/21/2022   SpO2 100%   BMI 21.77 kg/m  Physical Exam Vitals and nursing note reviewed.  Constitutional:      Appearance: She is well-developed. She is not ill-appearing.  HENT:     Head: Normocephalic and atraumatic.  Eyes:     Pupils: Pupils are equal, round, and reactive to light.  Cardiovascular:     Rate and Rhythm: Normal rate and regular rhythm.     Heart sounds: Normal heart sounds.   Pulmonary:     Effort: Pulmonary effort is normal. No respiratory distress.     Breath sounds: No wheezing.  Abdominal:     General: Bowel sounds are normal.     Palpations: Abdomen is soft.     Comments: Gravid, nontender  Musculoskeletal:     Cervical back: Neck supple.  Skin:    General: Skin is warm and dry.  Neurological:     Mental Status: She is alert and oriented to person, place, and time.  Psychiatric:        Mood and Affect: Mood normal.     ED Results / Procedures / Treatments   Labs (all labs ordered are listed, but only abnormal results are displayed) Labs Reviewed  URINALYSIS, ROUTINE W REFLEX MICROSCOPIC    EKG None  Radiology No results found.  Procedures Procedures  {Document cardiac monitor, telemetry assessment procedure when appropriate:1}  Medications Ordered in ED Medications  lactated ringers bolus 1,000 mL (has no administration in time range)    ED Course/ Medical Decision Making/ A&P   {   Click here for ABCD2, HEART and other calculatorsREFRESH Note before signing :1}  Medical Decision Making Amount and/or Complexity of Data Reviewed Labs: ordered.   ***  {Document critical care time when appropriate:1} {Document review of labs and clinical decision tools ie heart score, Chads2Vasc2 etc:1}  {Document your independent review of radiology images, and any outside records:1} {Document your discussion with family members, caretakers, and with consultants:1} {Document social determinants of health affecting pt's care:1} {Document your decision making why or why not admission, treatments were needed:1} Final Clinical Impression(s) / ED Diagnoses Final diagnoses:  None    Rx / DC Orders ED Discharge Orders     None

## 2023-11-11 ENCOUNTER — Emergency Department (HOSPITAL_COMMUNITY)
Admission: EM | Admit: 2023-11-11 | Discharge: 2023-11-11 | Disposition: A | Discharge status: Home / Self Care | Attending: Emergency Medicine | Admitting: Emergency Medicine

## 2023-11-11 ENCOUNTER — Emergency Department (HOSPITAL_COMMUNITY)

## 2023-11-11 ENCOUNTER — Ambulatory Visit: Admission: EM | Admit: 2023-11-11 | Discharge: 2023-11-11 | Discharge status: Against Medical Advice

## 2023-11-11 ENCOUNTER — Encounter (HOSPITAL_COMMUNITY): Payer: Self-pay

## 2023-11-11 ENCOUNTER — Other Ambulatory Visit: Payer: Self-pay

## 2023-11-11 DIAGNOSIS — S20212A Contusion of left front wall of thorax, initial encounter: Secondary | ICD-10-CM | POA: Insufficient documentation

## 2023-11-11 DIAGNOSIS — S060X1A Concussion with loss of consciousness of 30 minutes or less, initial encounter: Secondary | ICD-10-CM | POA: Insufficient documentation

## 2023-11-11 DIAGNOSIS — S0990XA Unspecified injury of head, initial encounter: Secondary | ICD-10-CM | POA: Diagnosis present

## 2023-11-11 DIAGNOSIS — H538 Other visual disturbances: Secondary | ICD-10-CM | POA: Insufficient documentation

## 2023-11-11 MED ORDER — OXYCODONE-ACETAMINOPHEN 5-325 MG PO TABS
1.0000 | ORAL_TABLET | Freq: Once | ORAL | Status: AC
  Administered 2023-11-11: 1 via ORAL
  Filled 2023-11-11: qty 1

## 2023-11-11 MED ORDER — OXYCODONE-ACETAMINOPHEN 5-325 MG PO TABS: 1.0000 | ORAL_TABLET | Freq: Four times a day (QID) | ORAL | 0 refills | Status: AC | PRN

## 2023-11-11 NOTE — Discharge Instructions (Signed)
 As we discussed, you do likely have a concussion.  This will take time to heal itself.  If your symptoms begin to get worse or your vision gets worse you need to return to the emergency department and we need to proceed with CT scans.  Please use the incentive spirometer for your rib contusions.  You can take the Percocet as prescribed.  Please follow-up with your primary care doctor if you have one.
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
## 2023-11-11 NOTE — ED Notes (Signed)
 Patient transported to X-ray

## 2023-11-11 NOTE — ED Notes (Signed)
 Pt educated on use of IS. Pt tolerated well.
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
## 2024-07-23 ENCOUNTER — Other Ambulatory Visit: Payer: Self-pay

## 2024-07-23 ENCOUNTER — Emergency Department (HOSPITAL_BASED_OUTPATIENT_CLINIC_OR_DEPARTMENT_OTHER)
Admission: EM | Admit: 2024-07-23 | Discharge: 2024-07-23 | Disposition: A | Discharge status: Home / Self Care | Attending: Emergency Medicine | Admitting: Emergency Medicine

## 2024-07-23 DIAGNOSIS — Z711 Person with feared health complaint in whom no diagnosis is made: Secondary | ICD-10-CM | POA: Insufficient documentation

## 2024-07-23 DIAGNOSIS — Z30012 Encounter for prescription of emergency contraception: Secondary | ICD-10-CM | POA: Diagnosis not present

## 2024-07-23 LAB — WET PREP, GENITAL
Clue Cells Wet Prep HPF POC: NONE SEEN
Sperm: NONE SEEN
Trich, Wet Prep: NONE SEEN
WBC, Wet Prep HPF POC: >=10 — AB
Yeast Wet Prep HPF POC: NONE SEEN

## 2024-07-23 LAB — HIV ANTIBODY (ROUTINE TESTING W REFLEX): HIV Screen 4th Generation wRfx: NONREACTIVE

## 2024-07-23 LAB — RAPID HIV SCREEN (HIV 1/2 AB+AG)
HIV 1/2 Antibodies: NONREACTIVE
HIV-1 P24 Antigen - HIV24: NONREACTIVE

## 2024-07-23 LAB — PREGNANCY, URINE: Preg Test, Ur: NEGATIVE

## 2024-07-23 LAB — HEPATITIS B SURFACE ANTIGEN: Hepatitis B Surface Ag: NONREACTIVE

## 2024-07-23 LAB — HEPATITIS C ANTIBODY: HCV Ab: NONREACTIVE

## 2024-07-23 MED ORDER — BICTEGRAVIR-EMTRICITAB-TENOFOV 50-200-25 MG PO PREPACK
1.0000 | ORAL_TABLET | Freq: Every day | ORAL | Status: DC
  Administered 2024-07-23: 1 via ORAL
  Filled 2024-07-23: qty 1

## 2024-07-23 MED ORDER — BICTEGRAVIR-EMTRICITAB-TENOFOV 50-200-25 MG PO TABS
1.0000 | ORAL_TABLET | Freq: Every day | ORAL | Status: DC
  Filled 2024-07-23: qty 1

## 2024-07-23 MED ORDER — BIKTARVY 50-200-25 MG PO TABS: 1.0000 | ORAL_TABLET | Freq: Every day | ORAL | 0 refills | Status: AC

## 2024-07-23 MED ORDER — ULIPRISTAL ACETATE 30 MG PO TABS
30.0000 mg | ORAL_TABLET | Freq: Once | ORAL | Status: AC
  Administered 2024-07-23: 30 mg via ORAL
  Filled 2024-07-23: qty 1

## 2024-07-23 NOTE — Discharge Instructions (Signed)
 Follow-up STD testing in your MyChart.  Take Biktarvy  as prescribed.  Follow-up with your primary care doctor for further STD testing in the future as you will likely need repeat HIV testing.  Please abstain from sexual activity or use safe sex practices as we discussed.

## 2024-07-23 NOTE — ED Triage Notes (Signed)
 Arrives ambulatory to the ED requesting emergency contraception for unprotected sex she had one day ago.

## 2024-07-23 NOTE — Care Management (Signed)
 Consult received it is for labs and HIV follow up [facility] is not able to do this, the patient will have to see her PCP or call on Monday to set this up.

## 2024-07-23 NOTE — ED Notes (Signed)
 ED Provider at bedside.
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
## 2024-07-24 LAB — SYPHILIS: RPR W/REFLEX TO RPR TITER AND TREPONEMAL ANTIBODIES, TRADITIONAL SCREENING AND DIAGNOSIS ALGORITHM: RPR Ser Ql: NONREACTIVE

## 2024-07-24 LAB — GC/CHLAMYDIA PROBE AMP (~~LOC~~) NOT AT ARMC
Chlamydia: POSITIVE — AB
Comment: NEGATIVE
Comment: NORMAL
Neisseria Gonorrhea: NEGATIVE

## 2024-07-25 ENCOUNTER — Telehealth (HOSPITAL_BASED_OUTPATIENT_CLINIC_OR_DEPARTMENT_OTHER): Payer: Self-pay | Admitting: Physician Assistant

## 2024-07-25 MED ORDER — DOXYCYCLINE HYCLATE 100 MG PO CAPS: 100.0000 mg | ORAL_CAPSULE | Freq: Two times a day (BID) | ORAL | 0 refills | Status: AC

## 2024-07-25 NOTE — Telephone Encounter (Cosign Needed)
 Patient calling as she tested positive for chlamydia, no medications were sent to prior visit.  I have reviewed the chart and called in doxycycline  twice daily for 7 days.   Portions of this note were generated with Scientist, clinical (histocompatibility and immunogenetics). Dictation errors may occur despite best attempts at proofreading.

## 2024-07-26 ENCOUNTER — Ambulatory Visit (HOSPITAL_COMMUNITY): Payer: Self-pay
